# Patient Record
Sex: Female | Born: 1985 | Race: Black or African American | Hispanic: No | Marital: Single | Smoking: Never smoker
Health system: Southern US, Community
[De-identification: ages and names within clinical notes are randomized; demographics above are authoritative.]

## PROBLEM LIST (undated history)

## (undated) DIAGNOSIS — J45909 Unspecified asthma, uncomplicated: Secondary | ICD-10-CM

## (undated) DIAGNOSIS — K219 Gastro-esophageal reflux disease without esophagitis: Secondary | ICD-10-CM

---

## 2012-05-30 ENCOUNTER — Emergency Department (HOSPITAL_BASED_OUTPATIENT_CLINIC_OR_DEPARTMENT_OTHER)
Admission: EM | Admit: 2012-05-30 | Discharge: 2012-05-30 | Disposition: A | Discharge status: Home / Self Care | Payer: Self-pay | Attending: Emergency Medicine | Admitting: Emergency Medicine

## 2012-05-30 ENCOUNTER — Encounter (HOSPITAL_BASED_OUTPATIENT_CLINIC_OR_DEPARTMENT_OTHER): Payer: Self-pay | Admitting: *Deleted

## 2012-05-30 ENCOUNTER — Emergency Department (HOSPITAL_BASED_OUTPATIENT_CLINIC_OR_DEPARTMENT_OTHER): Payer: Self-pay

## 2012-05-30 DIAGNOSIS — R51 Headache: Secondary | ICD-10-CM | POA: Insufficient documentation

## 2012-05-30 DIAGNOSIS — N72 Inflammatory disease of cervix uteri: Secondary | ICD-10-CM | POA: Insufficient documentation

## 2012-05-30 DIAGNOSIS — Z331 Pregnant state, incidental: Secondary | ICD-10-CM | POA: Insufficient documentation

## 2012-05-30 DIAGNOSIS — Z79899 Other long term (current) drug therapy: Secondary | ICD-10-CM | POA: Insufficient documentation

## 2012-05-30 DIAGNOSIS — R110 Nausea: Secondary | ICD-10-CM | POA: Insufficient documentation

## 2012-05-30 DIAGNOSIS — J45909 Unspecified asthma, uncomplicated: Secondary | ICD-10-CM | POA: Insufficient documentation

## 2012-05-30 DIAGNOSIS — N760 Acute vaginitis: Secondary | ICD-10-CM | POA: Insufficient documentation

## 2012-05-30 HISTORY — DX: Unspecified asthma, uncomplicated: J45.909

## 2012-05-30 LAB — URINALYSIS, ROUTINE W REFLEX MICROSCOPIC
Bilirubin Urine: NEGATIVE
Glucose, UA: NEGATIVE mg/dL
Ketones, ur: NEGATIVE mg/dL
Protein, ur: NEGATIVE mg/dL
pH: 6 (ref 5.0–8.0)

## 2012-05-30 LAB — CBC WITH DIFFERENTIAL/PLATELET
Basophils Relative: 0 % (ref 0–1)
Eosinophils Absolute: 0.2 10*3/uL (ref 0.0–0.7)
Eosinophils Relative: 3 % (ref 0–5)
Hemoglobin: 11.5 g/dL — ABNORMAL LOW (ref 12.0–15.0)
Lymphs Abs: 3 10*3/uL (ref 0.7–4.0)
MCH: 28 pg (ref 26.0–34.0)
MCHC: 34.5 g/dL (ref 30.0–36.0)
MCV: 81.2 fL (ref 78.0–100.0)
Monocytes Absolute: 0.4 10*3/uL (ref 0.1–1.0)
Monocytes Relative: 6 % (ref 3–12)
RBC: 4.1 MIL/uL (ref 3.87–5.11)

## 2012-05-30 LAB — URINE MICROSCOPIC-ADD ON

## 2012-05-30 LAB — RH IG WORKUP (INCLUDES ABO/RH)
ABO/RH(D): B POS
Gestational Age(Wks): 4

## 2012-05-30 LAB — BASIC METABOLIC PANEL
BUN: 4 mg/dL — ABNORMAL LOW (ref 6–23)
Calcium: 9 mg/dL (ref 8.4–10.5)
Creatinine, Ser: 0.6 mg/dL (ref 0.50–1.10)
GFR calc non Af Amer: >90 mL/min (ref >90)
Glucose, Bld: 93 mg/dL (ref 70–99)
Potassium: 3.5 mEq/L (ref 3.5–5.1)

## 2012-05-30 LAB — WET PREP, GENITAL

## 2012-05-30 MED ORDER — CEFTRIAXONE SODIUM 250 MG IJ SOLR
250.0000 mg | Freq: Once | INTRAMUSCULAR | Status: AC
  Administered 2012-05-30: 250 mg via INTRAMUSCULAR
  Filled 2012-05-30: qty 250

## 2012-05-30 MED ORDER — METRONIDAZOLE 500 MG PO TABS: 500.0000 mg | ORAL_TABLET | Freq: Two times a day (BID) | ORAL | Status: DC

## 2012-05-30 MED ORDER — AZITHROMYCIN 250 MG PO TABS
1000.0000 mg | ORAL_TABLET | Freq: Once | ORAL | Status: AC
  Administered 2012-05-30: 1000 mg via ORAL
  Filled 2012-05-30: qty 4

## 2012-05-30 MED ORDER — LIDOCAINE HCL (PF) 1 % IJ SOLN
INTRAMUSCULAR | Status: AC
  Administered 2012-05-30: 5 mL
  Filled 2012-05-30: qty 5

## 2012-05-30 NOTE — ED Notes (Signed)
Pt. Is in no distress and has not complained of nausea or vomiting in room 12.

## 2012-05-30 NOTE — ED Notes (Signed)
Dizziness and headache x 3 days. Nausea.

## 2012-05-30 NOTE — ED Provider Notes (Signed)
History     CSN: [id number]  Arrival date & time 05/30/12  1842   First MD Initiated Contact with Patient 05/30/12 1916      Chief Complaint  Patient presents with  . Dizziness    (Consider location/radiation/quality/duration/timing/severity/associated sxs/prior treatment) HPI Comments: Pt state that she has had dizziness, nausea and a headache for the last 3 days:pt denies blurred vision, cough, fever,diarrhea:pt states that she has had a couple of episodes of vomiting:pt states that she checked her bp at home and it was 80/60:pt states that she is currently on her period and it has been longer then normal:pt denies cp,sob,abdominal pain:pt doesn't take any medications  The history is provided by the patient. No language interpreter was used.    Past Medical History  Diagnosis Date  . Asthma     History reviewed. No pertinent past surgical history.  No family history on file.  History  Substance Use Topics  . Smoking status: Never Smoker   . Smokeless tobacco: Not on file  . Alcohol Use: No    OB History   Grav Para Term Preterm Abortions TAB SAB Ect Mult Living                  Review of Systems  Constitutional: Negative.   Respiratory: Negative.   Cardiovascular: Negative.     Allergies  Review of patient's allergies indicates no known allergies.  Home Medications   Current Outpatient Rx  Name  Route  Sig  Dispense  Refill  . albuterol (PROVENTIL HFA;VENTOLIN HFA) 108 (90 BASE) MCG/ACT inhaler   Inhalation   Inhale 2 puffs into the lungs every 6 (six) hours as needed for wheezing.           BP 113/68  Pulse 70  Temp(Src) 98.4 F (36.9 C) (Oral)  Resp 18  Ht 5' (1.524 m)  Wt 170 lb (77.111 kg)  BMI 33.2 kg/m2  SpO2 100%  Physical Exam  Nursing note and vitals reviewed. Constitutional: She is oriented to person, place, and time. She appears well-developed and well-nourished.  HENT:  Head: Normocephalic and atraumatic.  Eyes: Conjunctivae  and EOM are normal.  Neck: Neck supple.  Cardiovascular: Normal rate and regular rhythm.   Pulmonary/Chest: Effort normal and breath sounds normal.  Abdominal: Soft. Bowel sounds are normal. There is no tenderness.  Genitourinary:  Pt has blood and yellow discharge noted  Musculoskeletal: Normal range of motion.  Neurological: She is alert and oriented to person, place, and time.  Skin: Skin is warm and dry.  Psychiatric: She has a normal mood and affect.    ED Course  Procedures (including critical care time)  Labs Reviewed  WET PREP, GENITAL - Abnormal; Notable for the following:    Clue Cells Wet Prep HPF POC MANY (*)    WBC, Wet Prep HPF POC MODERATE (*)    All other components within normal limits  URINALYSIS, ROUTINE W REFLEX MICROSCOPIC - Abnormal; Notable for the following:    APPearance CLOUDY (*)    Hgb urine dipstick TRACE (*)    Leukocytes, UA TRACE (*)    All other components within normal limits  PREGNANCY, URINE - Abnormal; Notable for the following:    Preg Test, Ur POSITIVE (*)    All other components within normal limits  CBC WITH DIFFERENTIAL - Abnormal; Notable for the following:    Hemoglobin 11.5 (*)    HCT 33.3 (*)    All other components within normal limits  BASIC METABOLIC PANEL - Abnormal; Notable for the following:    BUN 4 (*)    All other components within normal limits  URINE MICROSCOPIC-ADD ON - Abnormal; Notable for the following:    Squamous Epithelial / LPF FEW (*)    Bacteria, UA FEW (*)    All other components within normal limits  HCG, QUANTITATIVE, PREGNANCY - Abnormal; Notable for the following:    hCG, Beta Chain, Quant, S [id number] (*)    All other components within normal limits  GC/CHLAMYDIA PROBE AMP  RH IG WORKUP (INCLUDES ABO/RH)   No results found.  Date: 05/30/2012  Rate: 82  Rhythm: sinus arrhythmia  QRS Axis: normal  Intervals: normal  ST/T Wave abnormalities: normal  Conduction Disutrbances:none  Narrative  Interpretation:   Old EKG Reviewed: none available    1. Pregnancy   2. BV (bacterial vaginosis)   3. Cervicitis       MDM  Pt treated for cervicitis:pt to be given flagyl to go home with:pt left with Dr. Judd Lien for [place] and type and rh still pending        Teressa Lower, NP 05/30/12 2214

## 2012-05-31 LAB — GC/CHLAMYDIA PROBE AMP: GC Probe RNA: NEGATIVE

## 2012-05-31 NOTE — ED Provider Notes (Signed)
Medical screening examination/treatment/procedure(s) were performed by non-physician practitioner and as supervising physician I was immediately available for consultation/collaboration.  Aron Inge, MD 05/31/12 0744 

## 2013-10-13 ENCOUNTER — Emergency Department (HOSPITAL_BASED_OUTPATIENT_CLINIC_OR_DEPARTMENT_OTHER)
Admission: EM | Admit: 2013-10-13 | Discharge: 2013-10-13 | Disposition: A | Discharge status: Home / Self Care | Payer: Medicaid Other | Attending: Emergency Medicine | Admitting: Emergency Medicine

## 2013-10-13 ENCOUNTER — Encounter (HOSPITAL_BASED_OUTPATIENT_CLINIC_OR_DEPARTMENT_OTHER): Payer: Self-pay | Admitting: Emergency Medicine

## 2013-10-13 DIAGNOSIS — Z79899 Other long term (current) drug therapy: Secondary | ICD-10-CM | POA: Insufficient documentation

## 2013-10-13 DIAGNOSIS — Z3202 Encounter for pregnancy test, result negative: Secondary | ICD-10-CM | POA: Diagnosis not present

## 2013-10-13 DIAGNOSIS — J45909 Unspecified asthma, uncomplicated: Secondary | ICD-10-CM | POA: Diagnosis not present

## 2013-10-13 DIAGNOSIS — R112 Nausea with vomiting, unspecified: Secondary | ICD-10-CM | POA: Insufficient documentation

## 2013-10-13 DIAGNOSIS — R197 Diarrhea, unspecified: Secondary | ICD-10-CM | POA: Diagnosis not present

## 2013-10-13 DIAGNOSIS — R1110 Vomiting, unspecified: Secondary | ICD-10-CM

## 2013-10-13 LAB — BASIC METABOLIC PANEL
ANION GAP: 16 — AB (ref 5–15)
BUN: 10 mg/dL (ref 6–23)
CALCIUM: 8.7 mg/dL (ref 8.4–10.5)
CHLORIDE: 102 meq/L (ref 96–112)
CO2: 23 meq/L (ref 19–32)
CREATININE: 0.7 mg/dL (ref 0.50–1.10)
GFR calc Af Amer: >90 mL/min (ref >90)
GFR calc non Af Amer: >90 mL/min (ref >90)
Glucose, Bld: 99 mg/dL (ref 70–99)
Potassium: 3.7 mEq/L (ref 3.7–5.3)
Sodium: 141 mEq/L (ref 137–147)

## 2013-10-13 LAB — URINE MICROSCOPIC-ADD ON

## 2013-10-13 LAB — PREGNANCY, URINE: PREG TEST UR: NEGATIVE

## 2013-10-13 LAB — URINALYSIS, ROUTINE W REFLEX MICROSCOPIC
Glucose, UA: NEGATIVE mg/dL
HGB URINE DIPSTICK: NEGATIVE
KETONES UR: 15 mg/dL — AB
Leukocytes, UA: NEGATIVE
Nitrite: NEGATIVE
PROTEIN: 30 mg/dL — AB
Specific Gravity, Urine: 1.03 (ref 1.005–1.030)
UROBILINOGEN UA: 1 mg/dL (ref 0.0–1.0)
pH: 5.5 (ref 5.0–8.0)

## 2013-10-13 MED ORDER — ONDANSETRON 4 MG PO TBDP: 4.0000 mg | ORAL_TABLET | Freq: Three times a day (TID) | ORAL | Status: DC | PRN

## 2013-10-13 MED ORDER — ONDANSETRON HCL 4 MG/2ML IJ SOLN
4.0000 mg | Freq: Once | INTRAMUSCULAR | Status: AC
  Administered 2013-10-13: 4 mg via INTRAVENOUS
  Filled 2013-10-13: qty 2

## 2013-10-13 MED ORDER — SODIUM CHLORIDE 0.9 % IV BOLUS (SEPSIS)
1000.0000 mL | Freq: Once | INTRAVENOUS | Status: AC
  Administered 2013-10-13: 1000 mL via INTRAVENOUS

## 2013-10-13 NOTE — ED Provider Notes (Signed)
Medical screening examination/treatment/procedure(s) were performed by non-physician practitioner and as supervising physician I was immediately available for consultation/collaboration.  Megan E Docherty, MD 10/13/13 2344 

## 2013-10-13 NOTE — ED Notes (Signed)
Pt reports that her dad was in the hospital yesterday for food poisoning.  States that she has had N/V/D since last night.

## 2013-10-13 NOTE — Discharge Instructions (Signed)

## 2013-10-13 NOTE — ED Provider Notes (Signed)
CSN: [id number]     Arrival date & time 10/13/13  1834 History   First MD Initiated Contact with Patient 10/13/13 1920     Chief Complaint  Patient presents with  . Diarrhea     (Consider location/radiation/quality/duration/timing/severity/associated sxs/prior Treatment) HPI Comments: Pt c/o n/v/d since last night. Other people in the house of similar symptoms. States that she hasn't taken any food by mouth today because she couldn't even keep down water. Hasn't been able to keep down and medication. No fever or abdominal pain. States that she has continued to have multiple episodes of diarrhea. No blood noted  The history is provided by the patient. No language interpreter was used.    Past Medical History  Diagnosis Date  . Asthma    History reviewed. No pertinent past surgical history. History reviewed. No pertinent family history. History  Substance Use Topics  . Smoking status: Never Smoker   . Smokeless tobacco: Not on file  . Alcohol Use: No   OB History   Grav Para Term Preterm Abortions TAB SAB Ect Mult Living                 Review of Systems  Constitutional: Negative.   Respiratory: Negative.   Cardiovascular: Negative.       Allergies  Review of patient's allergies indicates no known allergies.  Home Medications   Prior to Admission medications   Medication Sig Start Date End Date Taking? Authorizing Provider  albuterol (PROVENTIL HFA;VENTOLIN HFA) 108 (90 BASE) MCG/ACT inhaler Inhale 2 puffs into the lungs every 6 (six) hours as needed for wheezing.    Historical Provider, MD   BP 110/58  Pulse 92  Temp(Src) 98.3 F (36.8 C) (Oral)  Resp 18  Ht 5' (1.524 m)  Wt 170 lb (77.111 kg)  BMI 33.20 kg/m2  SpO2 100% Physical Exam  Nursing note and vitals reviewed. Constitutional: She is oriented to person, place, and time. She appears well-developed and well-nourished.  HENT:  Head: Normocephalic and atraumatic.  Cardiovascular: Normal rate and  regular rhythm.   Pulmonary/Chest: Effort normal and breath sounds normal.  Abdominal: Soft. Bowel sounds are normal. There is no tenderness.  Musculoskeletal: Normal range of motion.  Neurological: She is alert and oriented to person, place, and time. Coordination normal.  Skin: Skin is warm and dry.  Psychiatric: She has a normal mood and affect.    ED Course  Procedures (including critical care time) Labs Review Labs Reviewed  URINALYSIS, ROUTINE W REFLEX MICROSCOPIC - Abnormal; Notable for the following:    Color, Urine AMBER (*)    APPearance CLOUDY (*)    Bilirubin Urine SMALL (*)    Ketones, ur 15 (*)    Protein, ur 30 (*)    All other components within normal limits  BASIC METABOLIC PANEL - Abnormal; Notable for the following:    Anion gap 16 (*)    All other components within normal limits  URINE MICROSCOPIC-ADD ON - Abnormal; Notable for the following:    Squamous Epithelial / LPF FEW (*)    All other components within normal limits  PREGNANCY, URINE    Imaging Review No results found.   EKG Interpretation None      MDM   Final diagnoses:  Vomiting and diarrhea    Pt tolerating po without any problem. Pt hydrated without any problem. Abdomen is benign   Teressa LowerVrinda Shanoah Asbill, NP 10/13/13 2216

## 2013-11-14 IMAGING — US US OB TRANSVAGINAL
1 series · 14 of 28 positions shown · non-contrast
Comparison: None

CLINICAL DATA: 27-year-old pregnant female with pelvic discomfort
and dizziness.

OBSTETRIC <14 WK US AND TRANSVAGINAL OB US
TECHNIQUE: Both transabdominal and transvaginal ultrasound
examinations were performed for complete evaluation of the
gestation as well as the maternal uterus, adnexal regions, and
pelvic cul-de-sac.  Transvaginal technique was performed to assess
early pregnancy.

[Series 1: us ob transvaginal · 0.24mm/px · 14 of 64 slices shown]
[im 3/64]
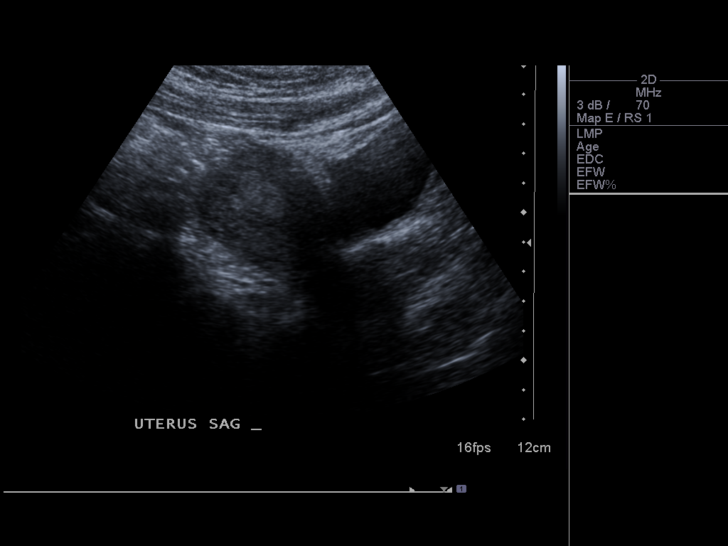
[im 8/64]
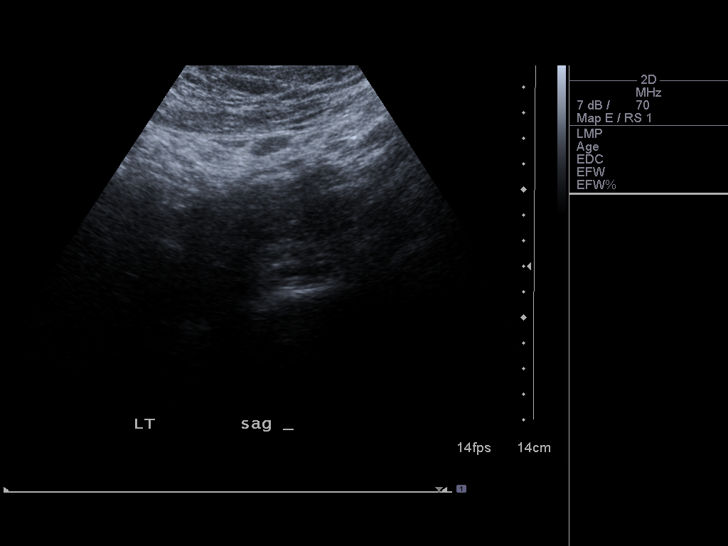
[im 12/64]
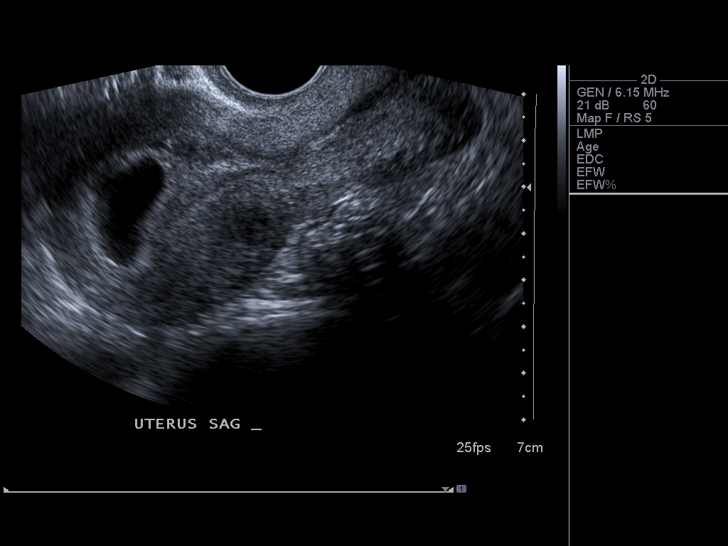
[im 17/64]
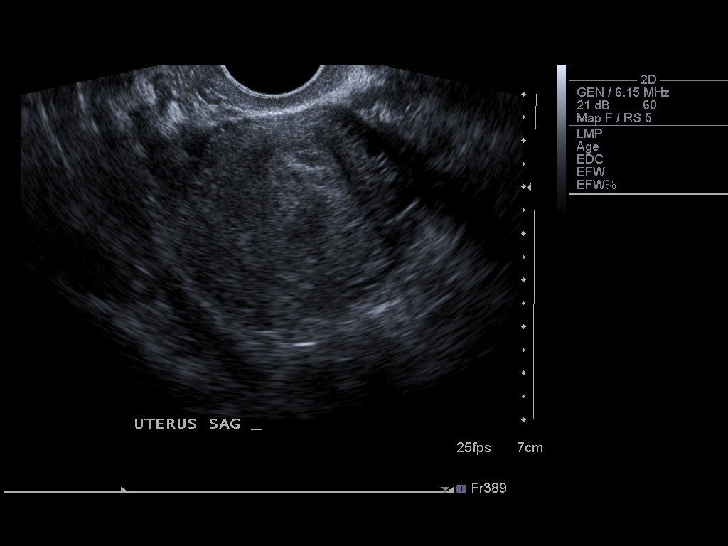
[im 22/64]
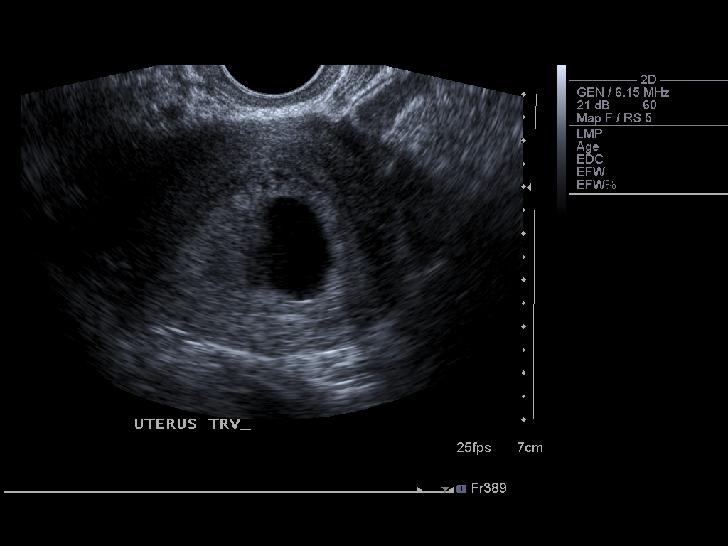
[im 26/64]
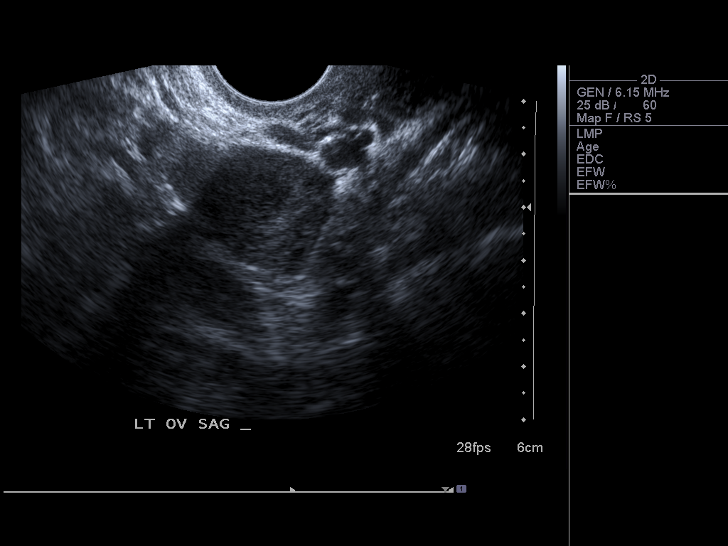
[im 31/64]
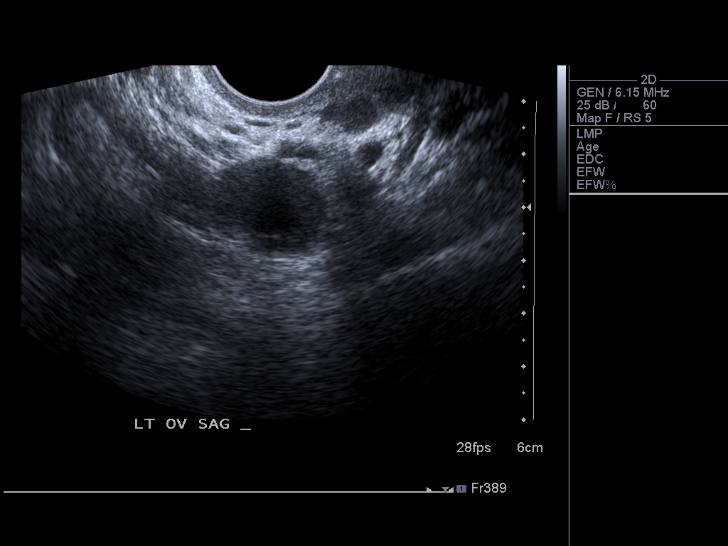
[im 36/64]
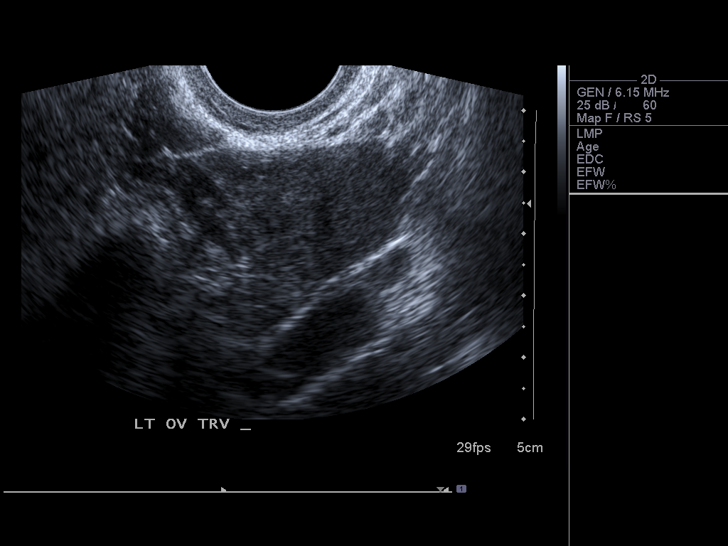
[im 40/64]
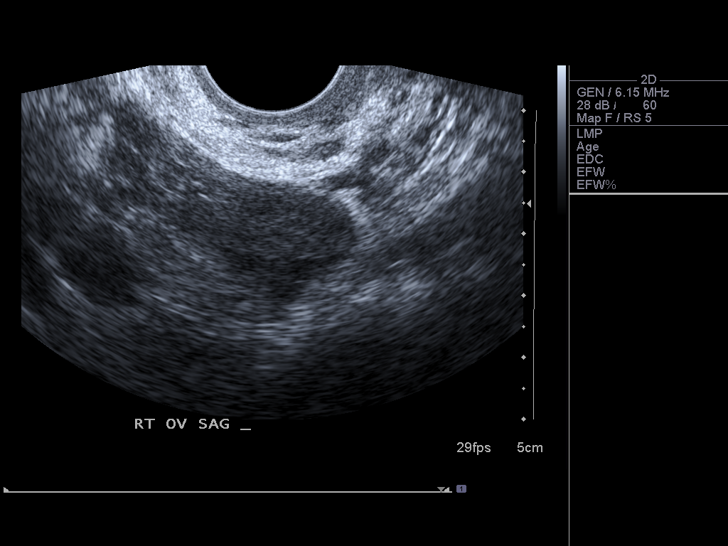
[im 45/64]
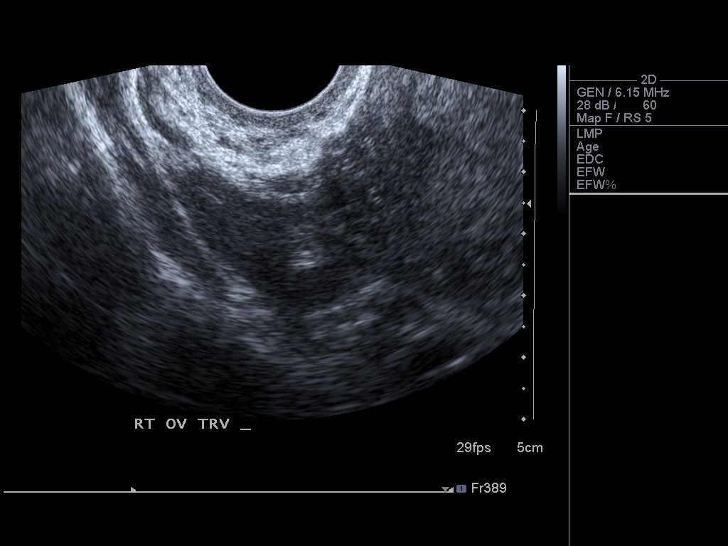
[im 50/64]
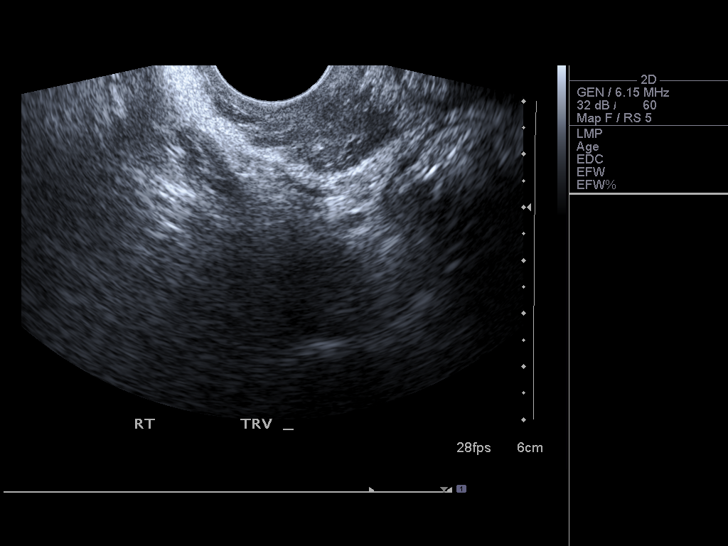
[im 54/64]
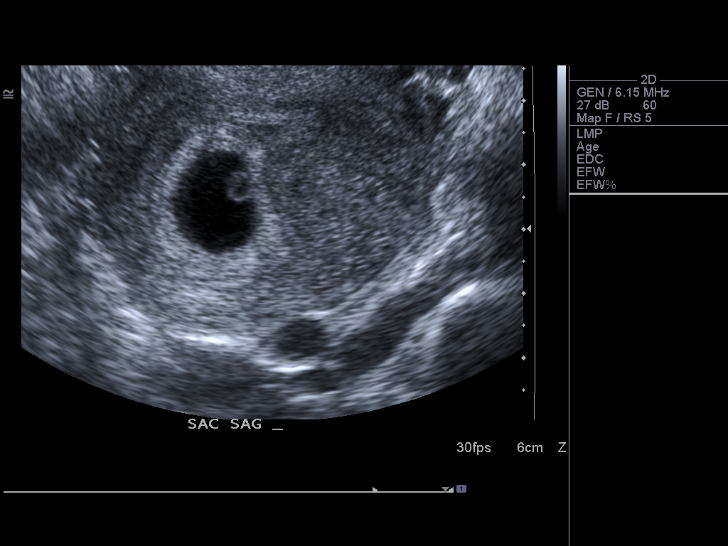
[im 59/64]
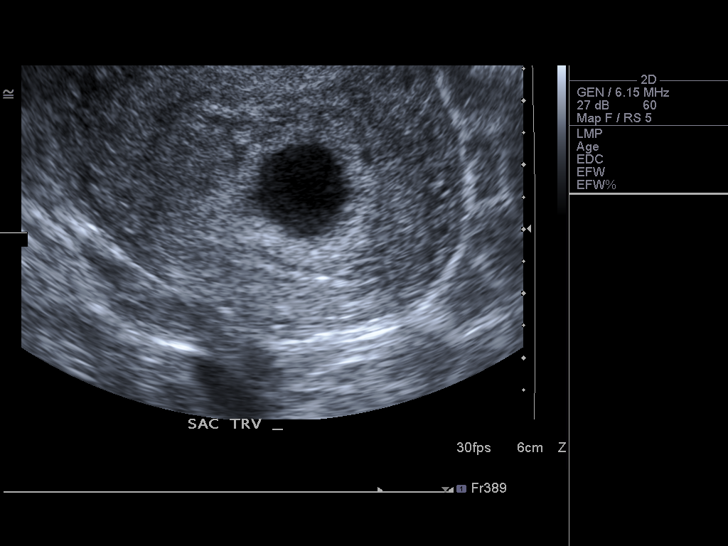
[im 64/64]
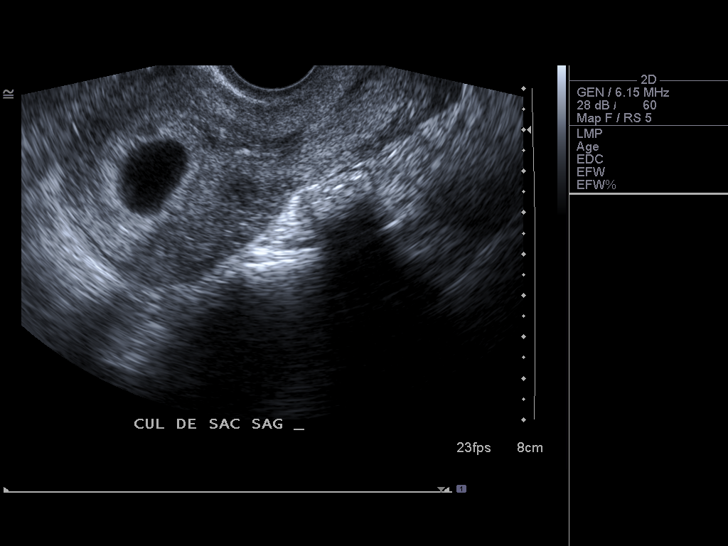

[14 of 28 positions shown; findings below may reference images not displayed]

Intrauterine gestational sac:  Visualized/normal in shape.
Yolk sac: Visualized
Embryo: Visualized
Cardiac Activity: Visualized
Heart Rate: 119 bpm

CRL: 43  mm  6 w  1 d         US EDC: 01/22/2013

Maternal uterus/adnexae:
There is no evidence of subchorionic hemorrhage.
The ovaries bilaterally are unremarkable.
There is no evidence of free fluid or adnexal mass.
IMPRESSION: Single living intrauterine gestation with estimated gestational age
of 6 weeks 1 day by this ultrasound.

## 2014-12-11 DIAGNOSIS — J4520 Mild intermittent asthma, uncomplicated: Secondary | ICD-10-CM | POA: Insufficient documentation

## 2014-12-11 DIAGNOSIS — J309 Allergic rhinitis, unspecified: Secondary | ICD-10-CM | POA: Insufficient documentation

## 2014-12-11 DIAGNOSIS — L209 Atopic dermatitis, unspecified: Secondary | ICD-10-CM

## 2014-12-11 DIAGNOSIS — L2089 Other atopic dermatitis: Secondary | ICD-10-CM | POA: Insufficient documentation

## 2014-12-11 DIAGNOSIS — L500 Allergic urticaria: Secondary | ICD-10-CM

## 2015-01-07 ENCOUNTER — Ambulatory Visit (INDEPENDENT_AMBULATORY_CARE_PROVIDER_SITE_OTHER): Payer: 59 | Admitting: *Deleted

## 2015-01-07 DIAGNOSIS — J309 Allergic rhinitis, unspecified: Secondary | ICD-10-CM | POA: Diagnosis not present

## 2015-01-07 MED ORDER — EPINEPHRINE 0.3 MG/0.3ML IJ SOAJ: 0.3000 mg | Freq: Once | INTRAMUSCULAR | Status: AC

## 2015-01-14 ENCOUNTER — Ambulatory Visit (INDEPENDENT_AMBULATORY_CARE_PROVIDER_SITE_OTHER): Payer: 59 | Admitting: *Deleted

## 2015-01-14 DIAGNOSIS — J309 Allergic rhinitis, unspecified: Secondary | ICD-10-CM | POA: Diagnosis not present

## 2015-02-04 ENCOUNTER — Ambulatory Visit (INDEPENDENT_AMBULATORY_CARE_PROVIDER_SITE_OTHER): Payer: 59 | Admitting: *Deleted

## 2015-02-04 DIAGNOSIS — J309 Allergic rhinitis, unspecified: Secondary | ICD-10-CM

## 2015-03-15 DIAGNOSIS — J309 Allergic rhinitis, unspecified: Secondary | ICD-10-CM

## 2015-03-15 NOTE — Progress Notes (Signed)
This encounter was created in error - please disregard.

## 2015-04-01 DIAGNOSIS — J301 Allergic rhinitis due to pollen: Secondary | ICD-10-CM | POA: Diagnosis not present

## 2015-04-02 DIAGNOSIS — J301 Allergic rhinitis due to pollen: Secondary | ICD-10-CM | POA: Diagnosis not present

## 2015-04-08 ENCOUNTER — Ambulatory Visit (INDEPENDENT_AMBULATORY_CARE_PROVIDER_SITE_OTHER): Payer: BLUE CROSS/BLUE SHIELD | Admitting: *Deleted

## 2015-04-08 DIAGNOSIS — J309 Allergic rhinitis, unspecified: Secondary | ICD-10-CM

## 2015-05-18 ENCOUNTER — Ambulatory Visit (INDEPENDENT_AMBULATORY_CARE_PROVIDER_SITE_OTHER): Payer: BLUE CROSS/BLUE SHIELD

## 2015-05-18 DIAGNOSIS — J309 Allergic rhinitis, unspecified: Secondary | ICD-10-CM | POA: Diagnosis not present

## 2015-07-15 ENCOUNTER — Telehealth: Payer: Self-pay | Admitting: Pediatrics

## 2015-07-15 NOTE — Telephone Encounter (Signed)
ADVISED FRONT DESK TO TELL PT IT GOES BY DATE OF SERVICE - CONTINUE TO MAKE MONTHLY PMTS

## 2015-07-15 NOTE — Telephone Encounter (Signed)
She called the office yesterday afternoon asking if her account has been placed into collections. She had received a statement saying that her account was 120+ days past due. She says that it hasn't been long since she had made a payment on her account. She will be in this afternoon for her injections if you can look at this for her and let her know when she comes in. Thanks

## 2015-07-22 ENCOUNTER — Ambulatory Visit (INDEPENDENT_AMBULATORY_CARE_PROVIDER_SITE_OTHER): Payer: BLUE CROSS/BLUE SHIELD

## 2015-07-22 DIAGNOSIS — J309 Allergic rhinitis, unspecified: Secondary | ICD-10-CM | POA: Diagnosis not present

## 2015-08-12 ENCOUNTER — Ambulatory Visit (INDEPENDENT_AMBULATORY_CARE_PROVIDER_SITE_OTHER): Payer: BLUE CROSS/BLUE SHIELD | Admitting: *Deleted

## 2015-08-12 DIAGNOSIS — J309 Allergic rhinitis, unspecified: Secondary | ICD-10-CM

## 2015-12-16 ENCOUNTER — Other Ambulatory Visit: Payer: Self-pay | Admitting: *Deleted

## 2015-12-16 MED ORDER — ALBUTEROL SULFATE HFA 108 (90 BASE) MCG/ACT IN AERS: 2.0000 | INHALATION_SPRAY | RESPIRATORY_TRACT | 0 refills | Status: DC | PRN

## 2015-12-16 NOTE — Telephone Encounter (Signed)
Spoke with patient, she was calling to find out if she was allergic to bees, she was stung but she would not tell me what symptoms she had because she stated she was fine now. Informed her that I looked through her chart and theres no record of bee allergy because she was never tested for that. She has not been seen since 07/25/2013 and she had made an apt to be seen. I refilled 1 proair hfa with no refills because stated she was completely out.

## 2015-12-23 ENCOUNTER — Ambulatory Visit (INDEPENDENT_AMBULATORY_CARE_PROVIDER_SITE_OTHER): Payer: BLUE CROSS/BLUE SHIELD | Admitting: Allergy and Immunology

## 2016-01-06 ENCOUNTER — Ambulatory Visit: Payer: BLUE CROSS/BLUE SHIELD | Admitting: Allergy and Immunology

## 2016-01-13 ENCOUNTER — Encounter: Payer: Self-pay | Admitting: Allergy and Immunology

## 2016-01-13 ENCOUNTER — Ambulatory Visit (INDEPENDENT_AMBULATORY_CARE_PROVIDER_SITE_OTHER): Payer: BLUE CROSS/BLUE SHIELD | Admitting: Allergy and Immunology

## 2016-01-13 VITALS — BP 106/70 | HR 76 | Temp 98.4°F | Resp 16 | Ht 60.04 in | Wt 172.4 lb

## 2016-01-13 DIAGNOSIS — L2089 Other atopic dermatitis: Secondary | ICD-10-CM | POA: Diagnosis not present

## 2016-01-13 DIAGNOSIS — L500 Allergic urticaria: Secondary | ICD-10-CM | POA: Diagnosis not present

## 2016-01-13 DIAGNOSIS — J4531 Mild persistent asthma with (acute) exacerbation: Secondary | ICD-10-CM

## 2016-01-13 DIAGNOSIS — J309 Allergic rhinitis, unspecified: Secondary | ICD-10-CM | POA: Diagnosis not present

## 2016-01-13 MED ORDER — LEVOCETIRIZINE DIHYDROCHLORIDE 5 MG PO TABS: 5.0000 mg | ORAL_TABLET | Freq: Every evening | ORAL | 5 refills | Status: DC

## 2016-01-13 MED ORDER — PREDNISONE 1 MG PO TABS: 10.0000 mg | ORAL_TABLET | Freq: Every day | ORAL | Status: AC

## 2016-01-13 MED ORDER — MONTELUKAST SODIUM 10 MG PO TABS: 10.0000 mg | ORAL_TABLET | Freq: Every day | ORAL | 5 refills | Status: AC

## 2016-01-13 MED ORDER — ALBUTEROL SULFATE (2.5 MG/3ML) 0.083% IN NEBU: 2.5000 mg | INHALATION_SOLUTION | RESPIRATORY_TRACT | 1 refills | Status: DC | PRN

## 2016-01-13 MED ORDER — AZELASTINE-FLUTICASONE 137-50 MCG/ACT NA SUSP: 2.0000 | NASAL | 5 refills | Status: AC

## 2016-01-13 MED ORDER — MONTELUKAST SODIUM 10 MG PO TABS: 10.0000 mg | ORAL_TABLET | Freq: Every day | ORAL | 5 refills | Status: DC

## 2016-01-13 MED ORDER — DESONIDE 0.05 % EX CREA: TOPICAL_CREAM | CUTANEOUS | 3 refills | Status: AC

## 2016-01-13 MED ORDER — AZELASTINE-FLUTICASONE 137-50 MCG/ACT NA SUSP: NASAL | 5 refills | Status: DC

## 2016-01-13 NOTE — Addendum Note (Signed)
Addended by: Vincent PeyerKING, MICHELE A on: 01/13/2016 02:26 PM   Modules accepted: Orders

## 2016-01-13 NOTE — Assessment & Plan Note (Signed)
Well-controlled.  Continue appropriate skin care measures and desonide 0.05% ointment sparingly to affected areas as needed.

## 2016-01-13 NOTE — Progress Notes (Signed)
Follow-up Note  RE: Melissa Maxwell MRN: [id number] DOB: 02-Sep-1985 Date of Office Visit: 01/13/2016  Primary care provider: No PCP Per Patient Referring provider: No ref. provider found  History of present illness: Melissa Maxwell is a 30 y.o. female with asthma, allergic rhinitis, and history of urticaria presenting today for follow up.  She was last seen in this clinic in April 2015.  She reports that over the past few weeks she has been experiencing more frequent asthma symptoms.  Over the past week she has experienced wheezing multiple times per day and experienced nocturnal awakenings due to lower respiratory symptoms last night. She is interested in restarting aeroallergen immunotherapy to reduce symptoms and decrease medication requirement.  She reports that her eczema, which typically involves her face and antecubital fossae, has been well controlled with desonide 0.05% ointment sparingly to affected areas as needed.  She has not been experiencing urticaria recently.   Assessment and plan: Allergic rhinitis  Aeroallergen avoidance measures have been discussed and provided in written form.  A prescription has been provided for Dymista (azelastine/fluticasone) nasal spray, 1 spray per nostril twice daily as needed. Proper nasal spray technique has been discussed and demonstrated.  I have also recommended nasal saline spray (i.e., Simply Saline) or nasal saline lavage (i.e., NeilMed) as needed prior to medicated nasal sprays.  For thick post nasal drainage, nasal congestion, and/or sinus pressure, add guaifenesin 1200 mg (Mucinex Maximum Strength) plus/minus pseudoephedrine 120 mg  twice daily as needed with adequate hydration as discussed. Pseudoephedrine is only to be used for short-term relief of nasal/sinus congestion. Long-term use is discouraged due to potential side effects.  She will restart aeroallergen immunotherapy.   Mild persistent asthma Mild exacerbation.  Prednisone  has been provided, 20 mg x 4 days, 10 mg x1 day, then stop.  A prescription has been provided for montelukast 10 mg daily at bedtime.  Continue albuterol HFA, 1-2 inhalations every 4-6 hours as needed.  Subjective and objective measures of pulmonary function will be followed and the treatment plan will be adjusted accordingly.  Atopic eczema Well-controlled.  Continue appropriate skin care measures and desonide 0.05% ointment sparingly to affected areas as needed.  Allergic urticaria Currently quiescent.  Should symptoms recur, take levocetirizine 5 mg daily as needed and keep a detailed symptom/exposure journal.   Meds ordered this encounter  Medications  . predniSONE (DELTASONE) tablet 10 mg  . montelukast (SINGULAIR) 10 MG tablet    Sig: Take 1 tablet (10 mg total) by mouth at bedtime.    Dispense:  30 tablet    Refill:  5  . levocetirizine (XYZAL) 5 MG tablet    Sig: Take 1 tablet (5 mg total) by mouth every evening.    Dispense:  30 tablet    Refill:  5  . Azelastine-Fluticasone (DYMISTA) 137-50 MCG/ACT SUSP    Sig: Place 2 sprays into both nostrils 1 day or 1 dose.    Dispense:  1 Bottle    Refill:  5    Diagnostics: Spirometry reveals an FVC of 2.44 L and an FEV1 of 2.29 L with 100 mL (4%) post bronchodilator improvement.  Please see scanned spirometry results for details. Epicutaneous testing: Positive to mold.  Given the robust positive results to grass pollens, weed pollens, ragweed pollen, and tree pollens on her previous tests, these were not retested today.  Intradermal testing: Positive to cat hair, dog epithelia, dust mite, cockroach antigen.    Physical examination: Blood pressure 106/70, pulse 76,  temperature 98.4 F (36.9 C), temperature source Oral, resp. rate 16, height 5' 0.04" (1.525 m), weight 172 lb 6.4 oz (78.2 kg), SpO2 98 %.  General: Alert, interactive, in no acute distress. HEENT: TMs pearly gray, turbinates edematous and pale with clear  discharge, post-pharynx moderately erythematous. Neck: Supple without lymphadenopathy. Lungs: Clear to auscultation without wheezing, rhonchi or rales. CV: Normal S1, S2 without murmurs. Skin: Warm and dry, without lesions or rashes.  The following portions of the patient's history were reviewed and updated as appropriate: allergies, current medications, past family history, past medical history, past social history, past surgical history and problem list.    Medication List       Accurate as of 01/13/16 12:57 PM. Always use your most recent med list.          Azelastine-Fluticasone 137-50 MCG/ACT Susp Commonly known as:  DYMISTA Place 2 sprays into both nostrils 1 day or 1 dose.   desonide 0.05 % cream Commonly known as:  DESOWEN Apply topically as needed.   EPINEPHrine 0.3 mg/0.3 mL Soaj injection Commonly known as:  EPIPEN 2-PAK Inject 0.3 mLs (0.3 mg total) into the muscle once.   etonogestrel-ethinyl estradiol 0.12-0.015 MG/24HR vaginal ring Commonly known as:  NUVARING Insert vaginally and leave in place for 3 consecutive weeks, then remove for 1 week.   levocetirizine 5 MG tablet Commonly known as:  XYZAL Take 1 tablet (5 mg total) by mouth every evening.   loratadine 10 MG tablet Commonly known as:  CLARITIN Take 10 mg by mouth.   mometasone 50 MCG/ACT nasal spray Commonly known as:  NASONEX Place 1 spray into the nose as needed.   montelukast 10 MG tablet Commonly known as:  SINGULAIR Take 1 tablet (10 mg total) by mouth at bedtime.   omeprazole 40 MG capsule Commonly known as:  PRILOSEC Take 40 mg by mouth.   PATADAY OP Apply 1 drop to eye as needed.   ranitidine 15 MG/ML syrup Commonly known as:  ZANTAC Take 150 mg by mouth.       No Known Allergies  Review of systems: Review of systems negative except as noted in HPI / PMHx or noted below: Constitutional: Negative.  HENT: Negative.   Eyes: Negative.  Respiratory: Negative.     Cardiovascular: Negative.  Gastrointestinal: Negative.  Genitourinary: Negative.  Musculoskeletal: Negative.  Neurological: Negative.  Endo/Heme/Allergies: Negative.  Cutaneous: Negative.  Past Medical History:  Diagnosis Date  . Asthma     History reviewed. No pertinent family history.  Social History   Social History  . Marital status: Single    Spouse name: N/A  . Number of children: N/A  . Years of education: N/A   Occupational History  . Not on file.   Social History Main Topics  . Smoking status: Never Smoker  . Smokeless tobacco: Never Used  . Alcohol use No  . Drug use: No  . Sexual activity: Not on file   Other Topics Concern  . Not on file   Social History Narrative  . No narrative on file    I appreciate the opportunity to take part in Ieisha's care. Please do not hesitate to contact me with questions.  Sincerely,   R. Jorene Guest, MD

## 2016-01-13 NOTE — Assessment & Plan Note (Signed)
Currently quiescent.  Should symptoms recur, take levocetirizine 5 mg daily as needed and keep a detailed symptom/exposure journal.

## 2016-01-13 NOTE — Assessment & Plan Note (Signed)
   Aeroallergen avoidance measures have been discussed and provided in written form.  A prescription has been provided for Dymista (azelastine/fluticasone) nasal spray, 1 spray per nostril twice daily as needed. Proper nasal spray technique has been discussed and demonstrated.  I have also recommended nasal saline spray (i.e., Simply Saline) or nasal saline lavage (i.e., NeilMed) as needed prior to medicated nasal sprays.  For thick post nasal drainage, nasal congestion, and/or sinus pressure, add guaifenesin 1200 mg (Mucinex Maximum Strength) plus/minus pseudoephedrine 120 mg  twice daily as needed with adequate hydration as discussed. Pseudoephedrine is only to be used for short-term relief of nasal/sinus congestion. Long-term use is discouraged due to potential side effects.  She will restart aeroallergen immunotherapy.

## 2016-01-13 NOTE — Assessment & Plan Note (Addendum)
Mild exacerbation.  Prednisone has been provided, 20 mg x 4 days, 10 mg x1 day, then stop.  A prescription has been provided for montelukast 10 mg daily at bedtime.  Continue albuterol HFA, 1-2 inhalations every 4-6 hours as needed.  Subjective and objective measures of pulmonary function will be followed and the treatment plan will be adjusted accordingly.

## 2016-01-13 NOTE — Patient Instructions (Addendum)
Allergic rhinitis  Aeroallergen avoidance measures have been discussed and provided in written form.  A prescription has been provided for Dymista (azelastine/fluticasone) nasal spray, 1 spray per nostril twice daily as needed. Proper nasal spray technique has been discussed and demonstrated.  I have also recommended nasal saline spray (i.e., Simply Saline) or nasal saline lavage (i.e., NeilMed) as needed prior to medicated nasal sprays.  For thick post nasal drainage, nasal congestion, and/or sinus pressure, add guaifenesin 1200 mg (Mucinex Maximum Strength) plus/minus pseudoephedrine 120 mg  twice daily as needed with adequate hydration as discussed. Pseudoephedrine is only to be used for short-term relief of nasal/sinus congestion. Long-term use is discouraged due to potential side effects.  She will restart aeroallergen immunotherapy.   Mild persistent asthma Mild exacerbation.  Prednisone has been provided, 20 mg x 4 days, 10 mg x1 day, then stop.  A prescription has been provided for montelukast 10 mg daily at bedtime.  Continue albuterol HFA, 1-2 inhalations every 4-6 hours as needed.  Subjective and objective measures of pulmonary function will be followed and the treatment plan will be adjusted accordingly.  Atopic eczema Well-controlled.  Continue appropriate skin care measures and desonide 0.05% ointment sparingly to affected areas as needed.  Allergic urticaria Currently quiescent.  Should symptoms recur, take levocetirizine 5 mg daily as needed and keep a detailed symptom/exposure journal.   Return in about 4 months (around 05/15/2016), or if symptoms worsen or fail to improve.  Reducing Pollen Exposure  The American Academy of Allergy, Asthma and Immunology suggests the following steps to reduce your exposure to pollen during allergy seasons.    1. Do not hang sheets or clothing out to dry; pollen may collect on these items. 2. Do not mow lawns or spend time  around freshly cut grass; mowing stirs up pollen. 3. Keep windows closed at night.  Keep car windows closed while driving. 4. Minimize morning activities outdoors, a time when pollen counts are usually at their highest. 5. Stay indoors as much as possible when pollen counts or humidity is high and on windy days when pollen tends to remain in the air longer. 6. Use air conditioning when possible.  Many air conditioners have filters that trap the pollen spores. 7. Use a HEPA room air filter to remove pollen form the indoor air you breathe.   Control of House Dust Mite Allergen  House dust mites play a major role in allergic asthma and rhinitis.  They occur in environments with high humidity wherever human skin, the food for dust mites is found. High levels have been detected in dust obtained from mattresses, pillows, carpets, upholstered furniture, bed covers, clothes and soft toys.  The principal allergen of the house dust mite is found in its feces.  A gram of dust may contain 1,000 mites and 250,000 fecal particles.  Mite antigen is easily measured in the air during house cleaning activities.    1. Encase mattresses, including the box spring, and pillow, in an air tight cover.  Seal the zipper end of the encased mattresses with wide adhesive tape. 2. Wash the bedding in water of 130 degrees Farenheit weekly.  Avoid cotton comforters/quilts and flannel bedding: the most ideal bed covering is the dacron comforter. 3. Remove all upholstered furniture from the bedroom. 4. Remove carpets, carpet padding, rugs, and non-washable window drapes from the bedroom.  Wash drapes weekly or use plastic window coverings. 5. Remove all non-washable stuffed toys from the bedroom.  Wash stuffed toys weekly.  6. Have the room cleaned frequently with a vacuum cleaner and a damp dust-mop.  The patient should not be in a room which is being cleaned and should wait 1 hour after cleaning before going into the  room. 7. Close and seal all heating outlets in the bedroom.  Otherwise, the room will become filled with dust-laden air.  An electric heater can be used to heat the room. Reduce indoor humidity to less than 50%.  Do not use a humidifier.  Control of Dog or Cat Allergen  Avoidance is the best way to manage a dog or cat allergy. If you have a dog or cat and are allergic to dog or cats, consider removing the dog or cat from the home. If you have a dog or cat but don't want to find it a new home, or if your family wants a pet even though someone in the household is allergic, here are some strategies that may help keep symptoms at bay:  1. Keep the pet out of your bedroom and restrict it to only a few rooms. Be advised that keeping the dog or cat in only one room will not limit the allergens to that room. 2. Don't pet, hug or kiss the dog or cat; if you do, wash your hands with soap and water. 3. High-efficiency particulate air (HEPA) cleaners run continuously in a bedroom or living room can reduce allergen levels over time. 4. Regular use of a high-efficiency vacuum cleaner or a central vacuum can reduce allergen levels. 5. Giving your dog or cat a bath at least once a week can reduce airborne allergen.  Control of Mold Allergen  Mold and fungi can grow on a variety of surfaces provided certain temperature and moisture conditions exist.  Outdoor molds grow on plants, decaying vegetation and soil.  The major outdoor mold, Alternaria and Cladosporium, are found in very high numbers during hot and dry conditions.  Generally, a late Summer - Fall peak is seen for common outdoor fungal spores.  Rain will temporarily lower outdoor mold spore count, but counts rise rapidly when the rainy period ends.  The most important indoor molds are Aspergillus and Penicillium.  Dark, humid and poorly ventilated basements are ideal sites for mold growth.  The next most common sites of mold growth are the bathroom and the  kitchen.  Outdoor MicrosoftMold Control 2. Use air conditioning and keep windows closed 3. Avoid exposure to decaying vegetation. 4. Avoid leaf raking. 5. Avoid grain handling. 6. Consider wearing a face mask if working in moldy areas.  Indoor Mold Control 1. Maintain humidity below 50%. 2. Clean washable surfaces with 5% bleach solution. 3. Remove sources e.g. Contaminated carpets.  Control of Cockroach Allergen  Cockroach allergen has been identified as an important cause of acute attacks of asthma, especially in urban settings.  There are fifty-five species of cockroach that exist in the [place], however only three, the [place], [place] and Oriental species produce allergen that can affect patients with Asthma.  Allergens can be obtained from fecal particles, egg casings and secretions from cockroaches.    1. Remove food sources. 2. Reduce access to water. 3. Seal access and entry points. 4. Spray runways with 0.5-1% Diazinon or Chlorpyrifos 5. Blow boric acid power under stoves and refrigerator. 6. Place bait stations (hydramethylnon) at feeding sites.

## 2016-01-17 ENCOUNTER — Other Ambulatory Visit: Payer: Self-pay | Admitting: Allergy

## 2016-01-17 NOTE — Progress Notes (Unsigned)
Vials to be made 01-17-16.  JM 

## 2016-01-17 NOTE — Telephone Encounter (Signed)
Dymista approved. From 01-14-2016 thru 04-02-2038

## 2016-02-15 DIAGNOSIS — J3089 Other allergic rhinitis: Secondary | ICD-10-CM | POA: Diagnosis not present

## 2016-02-16 DIAGNOSIS — J301 Allergic rhinitis due to pollen: Secondary | ICD-10-CM | POA: Diagnosis not present

## 2016-02-21 ENCOUNTER — Ambulatory Visit: Payer: BLUE CROSS/BLUE SHIELD

## 2016-03-13 ENCOUNTER — Ambulatory Visit (INDEPENDENT_AMBULATORY_CARE_PROVIDER_SITE_OTHER): Payer: BLUE CROSS/BLUE SHIELD

## 2016-03-13 DIAGNOSIS — J309 Allergic rhinitis, unspecified: Secondary | ICD-10-CM | POA: Diagnosis not present

## 2016-03-13 NOTE — Progress Notes (Signed)
Immunotherapy   Patient Details  Name: Melissa Maxwell MRN: [id number] Date of Birth: April 27, 1985  03/13/2016  Melissa Maxwell started injections for Blue 1:100,000 ( mold-cat-dog-CR and Pollen-DM) Following schedule: B  Frequency:2 times per week Epi-Pen:Epi-Pen Available  Consent signed and patient instructions given.   Damita Gainey 03/13/2016, 9:01 AM

## 2016-03-15 ENCOUNTER — Other Ambulatory Visit: Payer: Self-pay | Admitting: *Deleted

## 2016-03-15 DIAGNOSIS — J309 Allergic rhinitis, unspecified: Secondary | ICD-10-CM

## 2016-05-24 ENCOUNTER — Ambulatory Visit (INDEPENDENT_AMBULATORY_CARE_PROVIDER_SITE_OTHER): Payer: BLUE CROSS/BLUE SHIELD

## 2016-05-24 DIAGNOSIS — J309 Allergic rhinitis, unspecified: Secondary | ICD-10-CM | POA: Diagnosis not present

## 2016-08-02 ENCOUNTER — Encounter: Payer: Self-pay | Admitting: *Deleted

## 2016-08-10 ENCOUNTER — Ambulatory Visit (INDEPENDENT_AMBULATORY_CARE_PROVIDER_SITE_OTHER): Payer: BLUE CROSS/BLUE SHIELD

## 2016-08-10 ENCOUNTER — Other Ambulatory Visit: Payer: Self-pay | Admitting: *Deleted

## 2016-08-10 DIAGNOSIS — J309 Allergic rhinitis, unspecified: Secondary | ICD-10-CM

## 2016-08-10 MED ORDER — ALBUTEROL SULFATE HFA 108 (90 BASE) MCG/ACT IN AERS: 2.0000 | INHALATION_SPRAY | RESPIRATORY_TRACT | 0 refills | Status: DC | PRN

## 2016-08-10 NOTE — Telephone Encounter (Addendum)
Per chart note from 01/13/2016, Return in about 4 months (around 05/15/2016).  Patient needs OV. Called patient.  Made OV 09/07/2016 at 10:45 am.  Called in one refill Proventil HFA per patients request.

## 2016-08-10 NOTE — Telephone Encounter (Signed)
Pt is wondering if we can reifll her inhalers. Pharmacy is [facility] on [address] in [place]

## 2016-08-14 ENCOUNTER — Ambulatory Visit (INDEPENDENT_AMBULATORY_CARE_PROVIDER_SITE_OTHER): Payer: BLUE CROSS/BLUE SHIELD

## 2016-08-14 DIAGNOSIS — J309 Allergic rhinitis, unspecified: Secondary | ICD-10-CM | POA: Diagnosis not present

## 2016-08-21 ENCOUNTER — Ambulatory Visit (INDEPENDENT_AMBULATORY_CARE_PROVIDER_SITE_OTHER): Payer: BLUE CROSS/BLUE SHIELD

## 2016-08-21 DIAGNOSIS — J309 Allergic rhinitis, unspecified: Secondary | ICD-10-CM

## 2016-08-24 ENCOUNTER — Other Ambulatory Visit: Payer: Self-pay

## 2016-08-24 MED ORDER — ALBUTEROL SULFATE HFA 108 (90 BASE) MCG/ACT IN AERS: 2.0000 | INHALATION_SPRAY | RESPIRATORY_TRACT | 0 refills | Status: DC | PRN

## 2016-09-05 ENCOUNTER — Ambulatory Visit (INDEPENDENT_AMBULATORY_CARE_PROVIDER_SITE_OTHER): Payer: BLUE CROSS/BLUE SHIELD

## 2016-09-05 DIAGNOSIS — J309 Allergic rhinitis, unspecified: Secondary | ICD-10-CM | POA: Diagnosis not present

## 2016-09-07 ENCOUNTER — Ambulatory Visit (INDEPENDENT_AMBULATORY_CARE_PROVIDER_SITE_OTHER): Payer: BLUE CROSS/BLUE SHIELD | Admitting: Allergy and Immunology

## 2016-09-07 ENCOUNTER — Encounter: Payer: Self-pay | Admitting: Allergy and Immunology

## 2016-09-07 VITALS — BP 110/72 | HR 80 | Temp 98.0°F | Resp 18

## 2016-09-07 DIAGNOSIS — J4530 Mild persistent asthma, uncomplicated: Secondary | ICD-10-CM | POA: Diagnosis not present

## 2016-09-07 DIAGNOSIS — L500 Allergic urticaria: Secondary | ICD-10-CM | POA: Diagnosis not present

## 2016-09-07 DIAGNOSIS — L2089 Other atopic dermatitis: Secondary | ICD-10-CM | POA: Diagnosis not present

## 2016-09-07 DIAGNOSIS — J4520 Mild intermittent asthma, uncomplicated: Secondary | ICD-10-CM | POA: Diagnosis not present

## 2016-09-07 DIAGNOSIS — J3089 Other allergic rhinitis: Secondary | ICD-10-CM

## 2016-09-07 MED ORDER — LEVOCETIRIZINE DIHYDROCHLORIDE 5 MG PO TABS: 5.0000 mg | ORAL_TABLET | Freq: Every evening | ORAL | 5 refills | Status: DC

## 2016-09-07 NOTE — Patient Instructions (Addendum)
Mild intermittent asthma Well-controlled.  Continue albuterol HFA, 1-2 inhalations every 4-6 hours as needed.   If lower respiratory symptoms progress in frequency and/or severity, the patient is to resume montelukast 10 mg daily at bedtime.  Subjective and objective measures of pulmonary function will be followed and the treatment plan will be adjusted accordingly.  Allergic rhinitis Well-controlled and stable.  Continue appropriate allergen avoidance measures, aeroallergen immunotherapy injections as prescribed and as tolerated, levocetirizine as needed, and nasal steroid as needed.  Atopic eczema  Continue appropriate skin care measures and desonide 0.05% ointment sparingly to affected areas as needed.   Return in about 6 months (around 03/09/2017), or if symptoms worsen or fail to improve.

## 2016-09-07 NOTE — Progress Notes (Signed)
Follow-up Note  RE: Melissa DivineDerica Mckibbin MRN: [id number] DOB: 12-30-1985 Date of Office Visit: 09/07/2016  Primary care provider: Patient, No Pcp Per Referring provider: No ref. provider found  History of present illness: Melissa Maxwell is a 31 y.o. female with asthma, allergic rhinitis, and history of urticaria presents today for follow up.  She was last seen in this clinic in October 2017.  She reports that despite having discontinued montelukast in December she has only been requiring albuterol rescue 2-4 times per month on average.  She denies nocturnal awakenings due to lower respiratory symptoms.  She is tolerating aeroallergen immunotherapy injections and has no nasal symptom complaints today.  She has had no recurrence of urticaria.   Assessment and plan: Mild intermittent asthma Well-controlled.  Continue albuterol HFA, 1-2 inhalations every 4-6 hours as needed.   If lower respiratory symptoms progress in frequency and/or severity, the patient is to resume montelukast 10 mg daily at bedtime.  Subjective and objective measures of pulmonary function will be followed and the treatment plan will be adjusted accordingly.  Allergic rhinitis Well-controlled and stable.  Continue appropriate allergen avoidance measures, aeroallergen immunotherapy injections as prescribed and as tolerated, levocetirizine as needed, and nasal steroid as needed.  Atopic eczema  Continue appropriate skin care measures and desonide 0.05% ointment sparingly to affected areas as needed.   Meds ordered this encounter  Medications  . levocetirizine (XYZAL) 5 MG tablet    Sig: Take 1 tablet (5 mg total) by mouth every evening.    Dispense:  30 tablet    Refill:  5    Diagnostics: Spirometry:  Normal with an FEV1 of 96% predicted.  Please see scanned spirometry results for details.    Physical examination: Blood pressure 110/72, pulse 80, temperature 98 F (36.7 C), temperature source Oral, resp. rate  18, SpO2 97 %.  General: Alert, interactive, in no acute distress. HEENT: TMs pearly gray, turbinates minimally edematous without discharge, post-pharynx unremarkable. Neck: Supple without lymphadenopathy. Lungs: Clear to auscultation without wheezing, rhonchi or rales. CV: Normal S1, S2 without murmurs. Skin: Warm and dry, without lesions or rashes.  The following portions of the patient's history were reviewed and updated as appropriate: allergies, current medications, past family history, past medical history, past social history, past surgical history and problem list.  Allergies as of 09/07/2016   No Known Allergies     Medication List       Accurate as of 09/07/16 12:56 PM. Always use your most recent med list.          albuterol (2.5 MG/3ML) 0.083% nebulizer solution Commonly known as:  PROVENTIL Take 3 mLs (2.5 mg total) by nebulization every 4 (four) hours as needed for wheezing or shortness of breath.   albuterol 108 (90 Base) MCG/ACT inhaler Commonly known as:  PROVENTIL HFA Inhale 2 puffs into the lungs every 4 (four) hours as needed for wheezing or shortness of breath.   Azelastine-Fluticasone 137-50 MCG/ACT Susp Commonly known as:  DYMISTA Place 2 sprays into both nostrils 1 day or 1 dose.   desonide 0.05 % cream Commonly known as:  DESOWEN Apply twice daily to red itchy areas   EPINEPHrine 0.3 mg/0.3 mL Soaj injection Commonly known as:  EPIPEN 2-PAK Inject 0.3 mLs (0.3 mg total) into the muscle once.   etonogestrel-ethinyl estradiol 0.12-0.015 MG/24HR vaginal ring Commonly known as:  NUVARING Insert vaginally and leave in place for 3 consecutive weeks, then remove for 1 week.   levocetirizine 5 MG  tablet Commonly known as:  XYZAL Take 1 tablet (5 mg total) by mouth every evening.   loratadine 10 MG tablet Commonly known as:  CLARITIN Take 10 mg by mouth.   mometasone 50 MCG/ACT nasal spray Commonly known as:  NASONEX Place 1 spray into the nose as  needed.   montelukast 10 MG tablet Commonly known as:  SINGULAIR Take 1 tablet (10 mg total) by mouth at bedtime.   omeprazole 40 MG capsule Commonly known as:  PRILOSEC Take 40 mg by mouth.   PATADAY OP Apply 1 drop to eye as needed.   ranitidine 15 MG/ML syrup Commonly known as:  ZANTAC Take 150 mg by mouth.   UNABLE TO FIND Med Name: immunotherapy       No Known Allergies  I appreciate the opportunity to take part in Makalyn's care. Please do not hesitate to contact me with questions.  Sincerely,   R. Jorene Guest, MD

## 2016-09-07 NOTE — Assessment & Plan Note (Signed)
Well-controlled.  Continue albuterol HFA, 1-2 inhalations every 4-6 hours as needed.   If lower respiratory symptoms progress in frequency and/or severity, the patient is to resume montelukast 10 mg daily at bedtime.  Subjective and objective measures of pulmonary function will be followed and the treatment plan will be adjusted accordingly.

## 2016-09-07 NOTE — Assessment & Plan Note (Signed)
Well-controlled and stable.  Continue appropriate allergen avoidance measures, aeroallergen immunotherapy injections as prescribed and as tolerated, levocetirizine as needed, and nasal steroid as needed.

## 2016-09-07 NOTE — Assessment & Plan Note (Signed)
   Continue appropriate skin care measures and desonide 0.05% ointment sparingly to affected areas as needed. 

## 2016-09-15 ENCOUNTER — Ambulatory Visit (INDEPENDENT_AMBULATORY_CARE_PROVIDER_SITE_OTHER): Payer: BLUE CROSS/BLUE SHIELD | Admitting: *Deleted

## 2016-09-15 DIAGNOSIS — J309 Allergic rhinitis, unspecified: Secondary | ICD-10-CM

## 2016-09-18 ENCOUNTER — Other Ambulatory Visit: Payer: Self-pay | Admitting: Allergy

## 2016-09-18 MED ORDER — ALBUTEROL SULFATE HFA 108 (90 BASE) MCG/ACT IN AERS: 2.0000 | INHALATION_SPRAY | RESPIRATORY_TRACT | 1 refills | Status: DC | PRN

## 2016-09-22 ENCOUNTER — Ambulatory Visit (INDEPENDENT_AMBULATORY_CARE_PROVIDER_SITE_OTHER): Payer: BLUE CROSS/BLUE SHIELD | Admitting: *Deleted

## 2016-09-22 ENCOUNTER — Telehealth: Payer: Self-pay | Admitting: *Deleted

## 2016-09-22 DIAGNOSIS — J309 Allergic rhinitis, unspecified: Secondary | ICD-10-CM | POA: Diagnosis not present

## 2016-09-22 NOTE — Telephone Encounter (Signed)
Pt came in for allergy injections, asked for refill on Triamcinolone cream for her face, although I did not see in chart where that has been prescribed, only desonide. She states she has been using triamcinolone on her face because the desonide does not help. Informed her that triamcinolone is not to be used on the face. Dr gallagher approved to give her samples of Eucrisa to try.

## 2016-09-26 ENCOUNTER — Other Ambulatory Visit: Payer: Self-pay | Admitting: Allergy

## 2016-09-26 MED ORDER — ALBUTEROL SULFATE HFA 108 (90 BASE) MCG/ACT IN AERS: 2.0000 | INHALATION_SPRAY | RESPIRATORY_TRACT | 1 refills | Status: DC | PRN

## 2016-10-06 ENCOUNTER — Other Ambulatory Visit: Payer: Self-pay

## 2016-10-06 ENCOUNTER — Telehealth: Payer: Self-pay | Admitting: Allergy & Immunology

## 2016-10-06 NOTE — Telephone Encounter (Signed)
Pt has never had a rx for eucrisa. I left message for her to call us back. I will have to route this to her provider and verify it is ok to give rx. But waiting on return call as to reason why first.

## 2016-10-06 NOTE — Telephone Encounter (Signed)
Patient is requesting Melissa Maxwell Rx. Please call her back. Thanks

## 2016-10-09 ENCOUNTER — Other Ambulatory Visit: Payer: Self-pay

## 2016-10-09 ENCOUNTER — Ambulatory Visit (INDEPENDENT_AMBULATORY_CARE_PROVIDER_SITE_OTHER): Payer: BLUE CROSS/BLUE SHIELD

## 2016-10-09 DIAGNOSIS — J309 Allergic rhinitis, unspecified: Secondary | ICD-10-CM

## 2016-10-09 NOTE — Telephone Encounter (Signed)
Pt is wanting a rx for eucrisa is it ok to send in

## 2016-10-09 NOTE — Telephone Encounter (Signed)
Sure we can send that in. We could also give her some samples if she would like to come by the office. Please let her know that she can get a copay card online.  Malachi BondsJoel Gallagher, MD FAAAAI [facility] [place]

## 2016-10-10 ENCOUNTER — Other Ambulatory Visit: Payer: Self-pay

## 2016-10-10 MED ORDER — CRISABOROLE 2 % EX OINT: 1.0000 "application " | TOPICAL_OINTMENT | Freq: Two times a day (BID) | CUTANEOUS | 3 refills | Status: DC

## 2016-10-10 NOTE — Telephone Encounter (Signed)
I gave her samples and will send in rx

## 2016-10-11 NOTE — Telephone Encounter (Signed)
Per Vincent PeyerMichele King, she spoke with patient about PA for [place].  Patient has tried hydrocortisone 2.5 % cream and desonide.  Sent [place] thru CoverMyMeds today.  Pending outcome.

## 2016-10-16 ENCOUNTER — Telehealth: Payer: Self-pay

## 2016-10-16 NOTE — Telephone Encounter (Signed)
Noted - can you send in Protopic one application twice daily PRN eczema with one refill? Thanks!   Malachi BondsJoel Julez Huseby, MD FAAAAI [facility] [place] [place]

## 2016-10-16 NOTE — Telephone Encounter (Signed)
Spoke to pt. To let her know that her eucrisa 2% ointment was denied and that she first had to use the protopic. I'm aware that you are out of the office till Wednesday. I told pt. I will be in touch with her when Dr. Dellis AnesGallagher decides which ointment will be prescribed. I did leave some more samples of the eucrisa  For the pt. To pick up.

## 2016-10-17 MED ORDER — TACROLIMUS 0.1 % EX OINT: TOPICAL_OINTMENT | Freq: Two times a day (BID) | CUTANEOUS | 1 refills | Status: DC | PRN

## 2016-10-17 NOTE — Telephone Encounter (Signed)
Spoke with pt, protopic ointment needed a PA. Sent PA. Informed pt we are waiting to see see if protopic will be approved or not. She is picking up samples of eucrisa at the front today.

## 2016-10-17 NOTE — Telephone Encounter (Signed)
Spoke with pt let her know I sent in new rx. Pt stated that [facility]. [facility].

## 2016-10-17 NOTE — Telephone Encounter (Signed)
[facility]intment [facility].

## 2016-10-18 ENCOUNTER — Telehealth: Payer: Self-pay | Admitting: Allergy

## 2016-10-18 NOTE — Telephone Encounter (Signed)
Informed patient that Tacrolimus 0.1% had been approved.

## 2016-10-27 ENCOUNTER — Ambulatory Visit (INDEPENDENT_AMBULATORY_CARE_PROVIDER_SITE_OTHER): Payer: BLUE CROSS/BLUE SHIELD

## 2016-10-27 DIAGNOSIS — J309 Allergic rhinitis, unspecified: Secondary | ICD-10-CM

## 2016-11-03 ENCOUNTER — Ambulatory Visit (INDEPENDENT_AMBULATORY_CARE_PROVIDER_SITE_OTHER): Payer: BLUE CROSS/BLUE SHIELD

## 2016-11-03 DIAGNOSIS — J309 Allergic rhinitis, unspecified: Secondary | ICD-10-CM

## 2016-11-10 ENCOUNTER — Ambulatory Visit (INDEPENDENT_AMBULATORY_CARE_PROVIDER_SITE_OTHER): Payer: BLUE CROSS/BLUE SHIELD

## 2016-11-10 DIAGNOSIS — J309 Allergic rhinitis, unspecified: Secondary | ICD-10-CM

## 2016-11-17 ENCOUNTER — Ambulatory Visit (INDEPENDENT_AMBULATORY_CARE_PROVIDER_SITE_OTHER): Payer: BLUE CROSS/BLUE SHIELD

## 2016-11-17 DIAGNOSIS — J309 Allergic rhinitis, unspecified: Secondary | ICD-10-CM

## 2016-11-24 ENCOUNTER — Ambulatory Visit (INDEPENDENT_AMBULATORY_CARE_PROVIDER_SITE_OTHER): Payer: BLUE CROSS/BLUE SHIELD

## 2016-11-24 DIAGNOSIS — J309 Allergic rhinitis, unspecified: Secondary | ICD-10-CM

## 2016-12-06 ENCOUNTER — Ambulatory Visit (INDEPENDENT_AMBULATORY_CARE_PROVIDER_SITE_OTHER): Payer: BLUE CROSS/BLUE SHIELD | Admitting: *Deleted

## 2016-12-06 DIAGNOSIS — J309 Allergic rhinitis, unspecified: Secondary | ICD-10-CM

## 2016-12-06 NOTE — Progress Notes (Signed)
2 Vial sets made (except Blue) Exp. 12/06/17

## 2016-12-07 DIAGNOSIS — J3089 Other allergic rhinitis: Secondary | ICD-10-CM | POA: Diagnosis not present

## 2016-12-07 NOTE — Progress Notes (Signed)
PRINTED ADDITIONAL LABELS

## 2016-12-08 DIAGNOSIS — J301 Allergic rhinitis due to pollen: Secondary | ICD-10-CM | POA: Diagnosis not present

## 2016-12-19 ENCOUNTER — Ambulatory Visit (INDEPENDENT_AMBULATORY_CARE_PROVIDER_SITE_OTHER): Payer: BLUE CROSS/BLUE SHIELD

## 2016-12-19 DIAGNOSIS — J309 Allergic rhinitis, unspecified: Secondary | ICD-10-CM | POA: Diagnosis not present

## 2017-01-22 ENCOUNTER — Ambulatory Visit (INDEPENDENT_AMBULATORY_CARE_PROVIDER_SITE_OTHER): Payer: BLUE CROSS/BLUE SHIELD

## 2017-01-22 DIAGNOSIS — J309 Allergic rhinitis, unspecified: Secondary | ICD-10-CM | POA: Diagnosis not present

## 2017-04-18 ENCOUNTER — Other Ambulatory Visit: Payer: Self-pay | Admitting: Allergy

## 2017-04-18 DIAGNOSIS — L500 Allergic urticaria: Secondary | ICD-10-CM

## 2017-04-18 DIAGNOSIS — J3089 Other allergic rhinitis: Secondary | ICD-10-CM

## 2017-04-18 MED ORDER — LEVOCETIRIZINE DIHYDROCHLORIDE 5 MG PO TABS
5.0000 mg | ORAL_TABLET | Freq: Every evening | ORAL | 5 refills | Status: DC
Start: 2017-04-18 — End: 2017-07-30

## 2017-07-30 ENCOUNTER — Other Ambulatory Visit: Payer: Self-pay

## 2017-07-30 DIAGNOSIS — L500 Allergic urticaria: Secondary | ICD-10-CM

## 2017-07-30 DIAGNOSIS — J3089 Other allergic rhinitis: Secondary | ICD-10-CM

## 2017-07-30 MED ORDER — LEVOCETIRIZINE DIHYDROCHLORIDE 5 MG PO TABS: 5.0000 mg | ORAL_TABLET | Freq: Every evening | ORAL | 0 refills | Status: AC

## 2017-08-28 ENCOUNTER — Other Ambulatory Visit: Payer: Self-pay

## 2017-08-28 DIAGNOSIS — J3089 Other allergic rhinitis: Secondary | ICD-10-CM

## 2017-08-28 DIAGNOSIS — L500 Allergic urticaria: Secondary | ICD-10-CM

## 2017-09-07 ENCOUNTER — Other Ambulatory Visit: Payer: Self-pay

## 2017-09-07 DIAGNOSIS — J3089 Other allergic rhinitis: Secondary | ICD-10-CM

## 2017-09-07 DIAGNOSIS — L500 Allergic urticaria: Secondary | ICD-10-CM

## 2017-11-15 ENCOUNTER — Ambulatory Visit: Payer: Self-pay | Admitting: Allergy & Immunology

## 2017-11-29 ENCOUNTER — Ambulatory Visit: Payer: BLUE CROSS/BLUE SHIELD | Admitting: Family Medicine

## 2017-12-04 ENCOUNTER — Encounter: Payer: Self-pay | Admitting: Family Medicine

## 2017-12-04 ENCOUNTER — Ambulatory Visit (INDEPENDENT_AMBULATORY_CARE_PROVIDER_SITE_OTHER): Payer: BLUE CROSS/BLUE SHIELD | Admitting: Family Medicine

## 2017-12-04 VITALS — BP 106/70 | HR 76 | Temp 98.2°F | Resp 16 | Ht 60.0 in | Wt 181.7 lb

## 2017-12-04 DIAGNOSIS — L2089 Other atopic dermatitis: Secondary | ICD-10-CM

## 2017-12-04 DIAGNOSIS — J309 Allergic rhinitis, unspecified: Secondary | ICD-10-CM

## 2017-12-04 DIAGNOSIS — H1010 Acute atopic conjunctivitis, unspecified eye: Secondary | ICD-10-CM | POA: Insufficient documentation

## 2017-12-04 DIAGNOSIS — L500 Allergic urticaria: Secondary | ICD-10-CM

## 2017-12-04 DIAGNOSIS — J4530 Mild persistent asthma, uncomplicated: Secondary | ICD-10-CM | POA: Diagnosis not present

## 2017-12-04 MED ORDER — MONTELUKAST SODIUM 10 MG PO TABS: ORAL_TABLET | ORAL | 5 refills | Status: AC

## 2017-12-04 MED ORDER — LEVOCETIRIZINE DIHYDROCHLORIDE 5 MG PO TABS: ORAL_TABLET | ORAL | 5 refills | Status: DC

## 2017-12-04 MED ORDER — ALBUTEROL SULFATE HFA 108 (90 BASE) MCG/ACT IN AERS: 2.0000 | INHALATION_SPRAY | RESPIRATORY_TRACT | 1 refills | Status: AC | PRN

## 2017-12-04 MED ORDER — ALBUTEROL SULFATE (2.5 MG/3ML) 0.083% IN NEBU: 2.5000 mg | INHALATION_SOLUTION | RESPIRATORY_TRACT | 1 refills | Status: AC | PRN

## 2017-12-04 MED ORDER — FLUTICASONE PROPIONATE 50 MCG/ACT NA SUSP: NASAL | 5 refills | Status: AC

## 2017-12-04 MED ORDER — OLOPATADINE HCL 0.2 % OP SOLN: 1.0000 [drp] | Freq: Every day | OPHTHALMIC | 5 refills | Status: AC

## 2017-12-04 NOTE — Patient Instructions (Addendum)
Begin montelukast (Singulair) 10 mg once a day to prevent cough or wheeze Continue ProAir 2 puffs every 4 hours as needed for cough or wheeze or instead albuterol 0.083% via nebulizer 1 unit every 4 hours as needed for cough or wheeze. You may use ProAir 5-15 minutes before exercise to prevent cough or wheeze Continue Xyzal 5 mg once a day as needed for runny nose or itchy eyes Begin Flonase nasal spray 2 sprays in each nostril once a day as needed for stuffy nose Continue nasal saline rinses once a day. Use this before medicated nasal sprays. Pataday eye drop one drop in each eye once a day as needed for red or itchy eyes.   Continue the other medications as listed in the chart  Call me if this treatment plan is not working well for you  Follow up in 3 months or sooner if needed

## 2017-12-04 NOTE — Progress Notes (Signed)
[address] [place] [address] Dept: [phone]  FOLLOW UP NOTE  Patient ID: Melissa Maxwell, female    DOB: [birth year]  Age: 32 y.o. MRN: [id number] Date of Office Visit: 12/04/2017  Assessment  Chief Complaint: Allergic Rhinitis ; Asthma; and Wheezing (intermittently)  HPI Melissa Maxwell is a 32 year old female who presents to the clinic for a follow up visit. She was last seen in this clinic on 09/07/2016 by Dr. Nunzio Cobbs for evaluation of asthma, allergic rhinitis, and eczema. At that time, she restarted montelukast 10 mg once a day and used albuterol as needed for asthma. She continued allergen immunotherapy with no symptoms of allergic rhinitis. Eczema was reported as well controlled at that time.   At today's visit, she reports her asthma as moderately well controlled with symptoms including shortness of breath with heat and humidity as well as with exercise, intermittent wheezing associated with rest and activity, and a dry hacking cough that occurs a few times a week mainly at night. She is not currently taking montelukast and is using her ProAir inhaler twice a week.   Allergic rhinitis is reported as moderately well controlled with symptoms including nasal congestion with the right side more than the left, thick post nasal drainage, and increased throat clearing. She is not currently using any nose sprays as she reports the application process is hard to tolerate. She continues Xyzal as needed. She frequently has red and itchy eyes for which she uses Pataday eye drops with relief of itch. Allergic urticaria is reported as well controlled with as needed Xyzal.  Eczema is reported as well controlled. She currently sees [facility].   Her current medications are listed in the chart.    Drug Allergies:  Allergies  Allergen Reactions  . Molds & Smuts Itching    Physical Exam: BP 106/70 (BP Location: Right Arm, Patient Position: Sitting, Cuff Size: Normal)    Pulse 76   Temp 98.2 F (36.8 C) (Oral)   Resp 16   Ht 5' (1.524 m)   Wt 181 lb 10.5 oz (82.4 kg)   SpO2 98%   BMI 35.48 kg/m    Physical Exam  Constitutional: She is oriented to person, place, and time. She appears well-developed and well-nourished.  HENT:  Head: Normocephalic.  Right Ear: External ear normal.  Left Ear: External ear normal.  Mouth/Throat: Oropharynx is clear and moist.  Bilateral nares edematous and pale with right more than left. Pharynx normal. Ears normal. Eyes normal.   Eyes: Conjunctivae are normal.  Neck: Normal range of motion. Neck supple.  Cardiovascular: Normal rate, regular rhythm and normal heart sounds.  No murmur noted  Pulmonary/Chest: Effort normal and breath sounds normal.  Lungs clear to auscultation  Musculoskeletal: Normal range of motion.  Neurological: She is alert and oriented to person, place, and time.  Skin: Skin is warm and dry.  Psychiatric: She has a normal mood and affect. Her behavior is normal. Judgment and thought content normal.  Vitals reviewed.   Diagnostics: FVC 2.39, FEV1 2.22. Predicted FVC 2.73, predicted FEV1 2.32. Spirometry is within the normal range.  Assessment and Plan: 1. Mild persistent asthma without complication   2. Allergic rhinitis, unspecified seasonality, unspecified trigger   3. Other atopic dermatitis   4. Allergic urticaria   5. Seasonal allergic conjunctivitis     Meds ordered this encounter  Medications  . albuterol (PROAIR HFA) 108 (90 Base) MCG/ACT inhaler    Sig: Inhale 2 puffs into  the lungs every 4 (four) hours as needed for wheezing or shortness of breath.    Dispense:  1 Inhaler    Refill:  1  . albuterol (PROVENTIL) (2.5 MG/3ML) 0.083% nebulizer solution    Sig: Take 3 mLs (2.5 mg total) by nebulization every 4 (four) hours as needed for wheezing or shortness of breath.    Dispense:  75 mL    Refill:  1  . Olopatadine HCl (PATADAY) 0.2 % SOLN    Sig: Place 1 drop into both eyes  daily.    Dispense:  1 Bottle    Refill:  5  . montelukast (SINGULAIR) 10 MG tablet    Sig: Take 1 tablet once at bedtime for coughing or wheezing.    Dispense:  34 tablet    Refill:  5  . levocetirizine (XYZAL) 5 MG tablet    Sig: Take 1 tablet once a day for runny nose or itchy eyes.    Dispense:  34 tablet    Refill:  5  . fluticasone (FLONASE) 50 MCG/ACT nasal spray    Sig: 2 sprays once a day in needed for stuffy nose    Dispense:  16 g    Refill:  5    Patient Instructions  Begin montelukast (Singulair) 10 mg once a day to prevent cough or wheeze Continue ProAir 2 puffs every 4 hours as needed for cough or wheeze or instead albuterol 0.083% via nebulizer 1 unit every 4 hours as needed for cough or wheeze. You may use ProAir 5-15 minutes before exercise to prevent cough or wheeze Continue Xyzal 5 mg once a day as needed for runny nose or itchy eyes Begin Flonase nasal spray 2 sprays in each nostril once a day as needed for stuffy nose Continue nasal saline rinses once a day. Use this before medicated nasal sprays. Pataday eye drop one drop in each eye once a day as needed for red or itchy eyes.   Continue the other medications as listed in the chart  Call me if this treatment plan is not working well for you  Follow up in 3 months or sooner if needed   Return in about 3 months (around 03/05/2018), or if symptoms worsen or fail to improve.   Thank you for the opportunity to care for this patient.  Please do not hesitate to contact me with questions.  Thermon Leyland, FNP [facility]  I have provided oversight concerning Thermon Leyland' evaluation and treatment of this patient's health issues addressed during today's encounter. I agree with the assessment and therapeutic plan as outlined in the note.   Thank you for the opportunity to care for this patient.  Please do not hesitate to contact me with questions.  Tonette Bihari, M.D.  [facility] [address] [place], [place] [address] [phone]

## 2018-05-23 ENCOUNTER — Other Ambulatory Visit: Payer: Self-pay

## 2018-05-23 MED ORDER — LEVOCETIRIZINE DIHYDROCHLORIDE 5 MG PO TABS: ORAL_TABLET | ORAL | 0 refills | Status: AC

## 2018-08-18 ENCOUNTER — Other Ambulatory Visit: Payer: Self-pay | Admitting: Allergy and Immunology

## 2019-06-13 ENCOUNTER — Other Ambulatory Visit: Payer: Self-pay | Admitting: Allergy and Immunology

## 2019-07-22 ENCOUNTER — Emergency Department (HOSPITAL_BASED_OUTPATIENT_CLINIC_OR_DEPARTMENT_OTHER)
Admission: EM | Admit: 2019-07-22 | Discharge: 2019-07-22 | Disposition: A | Discharge status: Home / Self Care | Payer: Self-pay | Attending: Emergency Medicine | Admitting: Emergency Medicine

## 2019-07-22 ENCOUNTER — Encounter (HOSPITAL_BASED_OUTPATIENT_CLINIC_OR_DEPARTMENT_OTHER): Payer: Self-pay

## 2019-07-22 ENCOUNTER — Other Ambulatory Visit: Payer: Self-pay

## 2019-07-22 DIAGNOSIS — M25512 Pain in left shoulder: Secondary | ICD-10-CM | POA: Insufficient documentation

## 2019-07-22 DIAGNOSIS — J45909 Unspecified asthma, uncomplicated: Secondary | ICD-10-CM | POA: Diagnosis not present

## 2019-07-22 DIAGNOSIS — Y999 Unspecified external cause status: Secondary | ICD-10-CM | POA: Diagnosis not present

## 2019-07-22 DIAGNOSIS — Z79899 Other long term (current) drug therapy: Secondary | ICD-10-CM | POA: Diagnosis not present

## 2019-07-22 DIAGNOSIS — Y9389 Activity, other specified: Secondary | ICD-10-CM | POA: Diagnosis not present

## 2019-07-22 DIAGNOSIS — Y92410 Unspecified street and highway as the place of occurrence of the external cause: Secondary | ICD-10-CM | POA: Insufficient documentation

## 2019-07-22 DIAGNOSIS — M545 Low back pain: Secondary | ICD-10-CM | POA: Diagnosis not present

## 2019-07-22 DIAGNOSIS — M25511 Pain in right shoulder: Secondary | ICD-10-CM | POA: Insufficient documentation

## 2019-07-22 DIAGNOSIS — M542 Cervicalgia: Secondary | ICD-10-CM | POA: Insufficient documentation

## 2019-07-22 MED ORDER — METHOCARBAMOL 500 MG PO TABS: 500.0000 mg | ORAL_TABLET | Freq: Two times a day (BID) | ORAL | 0 refills | Status: DC

## 2019-07-22 MED ORDER — NAPROXEN 375 MG PO TABS: 375.0000 mg | ORAL_TABLET | Freq: Two times a day (BID) | ORAL | 0 refills | Status: DC
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
## 2019-09-30 ENCOUNTER — Encounter (HOSPITAL_BASED_OUTPATIENT_CLINIC_OR_DEPARTMENT_OTHER): Payer: Self-pay | Admitting: Emergency Medicine

## 2019-09-30 ENCOUNTER — Emergency Department (HOSPITAL_BASED_OUTPATIENT_CLINIC_OR_DEPARTMENT_OTHER)
Admission: EM | Admit: 2019-09-30 | Discharge: 2019-09-30 | Disposition: A | Discharge status: Home / Self Care | Payer: Self-pay | Attending: Emergency Medicine | Admitting: Emergency Medicine

## 2019-09-30 ENCOUNTER — Other Ambulatory Visit: Payer: Self-pay

## 2019-09-30 DIAGNOSIS — M542 Cervicalgia: Secondary | ICD-10-CM | POA: Insufficient documentation

## 2019-09-30 DIAGNOSIS — J45909 Unspecified asthma, uncomplicated: Secondary | ICD-10-CM | POA: Insufficient documentation

## 2019-09-30 HISTORY — DX: Gastro-esophageal reflux disease without esophagitis: K21.9

## 2019-09-30 MED ORDER — METHOCARBAMOL 500 MG PO TABS: 500.0000 mg | ORAL_TABLET | Freq: Two times a day (BID) | ORAL | 0 refills | Status: AC

## 2019-09-30 MED ORDER — NAPROXEN 375 MG PO TABS: 375.0000 mg | ORAL_TABLET | Freq: Two times a day (BID) | ORAL | 0 refills | Status: AC

## 2019-09-30 NOTE — Discharge Instructions (Signed)
I have prescribed muscle relaxers for your pain, please do not drink or drive while taking this medications as they can make you drowsy.  Please follow-up with PCP in 1 week for reevaluation of your symptoms.  You experience any bowel or bladder incontinence, fever, worsening in your symptoms please return to the ED. ° °
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
